# Patient Record
Sex: Female | Born: 2000 | Race: White | Hispanic: Yes | Marital: Single | Smoking: Never smoker
Health system: Southern US, Community
[De-identification: ages and names within clinical notes are randomized; demographics above are authoritative.]

## PROBLEM LIST (undated history)

## (undated) DIAGNOSIS — S83289A Other tear of lateral meniscus, current injury, unspecified knee, initial encounter: Secondary | ICD-10-CM

## (undated) DIAGNOSIS — S83249A Other tear of medial meniscus, current injury, unspecified knee, initial encounter: Secondary | ICD-10-CM

## (undated) DIAGNOSIS — S83511A Sprain of anterior cruciate ligament of right knee, initial encounter: Secondary | ICD-10-CM

## (undated) HISTORY — PX: NO PAST SURGERIES: SHX2092

---
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
## 2019-09-11 ENCOUNTER — Other Ambulatory Visit: Payer: Self-pay

## 2019-09-11 DIAGNOSIS — Z20822 Contact with and (suspected) exposure to covid-19: Secondary | ICD-10-CM

## 2019-09-12 LAB — NOVEL CORONAVIRUS, NAA: SARS-CoV-2, NAA: NOT DETECTED

## 2022-08-21 ENCOUNTER — Ambulatory Visit (INDEPENDENT_AMBULATORY_CARE_PROVIDER_SITE_OTHER): Payer: No Typology Code available for payment source | Admitting: Family Medicine

## 2022-08-21 ENCOUNTER — Encounter (HOSPITAL_BASED_OUTPATIENT_CLINIC_OR_DEPARTMENT_OTHER): Payer: Self-pay | Admitting: Family Medicine

## 2022-08-21 DIAGNOSIS — I10 Essential (primary) hypertension: Secondary | ICD-10-CM | POA: Diagnosis not present

## 2022-08-21 DIAGNOSIS — Z0000 Encounter for general adult medical examination without abnormal findings: Secondary | ICD-10-CM

## 2022-08-21 NOTE — Patient Instructions (Signed)
  Medication Instructions:  Your physician recommends that you continue on your current medications as directed. Please refer to the Current Medication list given to you today. --If you need a refill on any your medications before your next appointment, please call your pharmacy first. If no refills are authorized on file call the office.-- Lab Work: Your physician has recommended that you have lab work today: No If you have labs (blood work) drawn today and your tests are completely normal, you will receive your results via MyChart message OR a phone call from our staff.  Please ensure you check your voicemail in the event that you authorized detailed messages to be left on a delegated number. If you have any lab test that is abnormal or we need to change your treatment, we will call you to review the results.  Referrals/Procedures/Imaging: No  Follow-Up: Your next appointment:   Your physician recommends that you schedule a follow-up appointment in: 1-2 months cpe with Dr. de [place]. Nurse visit 1 week before for labs.   You will receive a text message or e-mail with a link to a survey about your care and experience with us today! We would greatly appreciate your feedback!   Thanks for letting us be apart of your health journey!!  Primary Care and Sports Medicine   Dr. Raymond de Cuba   We encourage you to activate your patient portal called "MyChart".  Sign up information is provided on this After Visit Summary.  MyChart is used to connect with patients for Virtual Visits (Telemedicine).  Patients are able to view lab/test results, encounter notes, upcoming appointments, etc.  Non-urgent messages can be sent to your provider as well. To learn more about what you can do with MyChart, please visit --  [email or web address].    

## 2022-08-21 NOTE — Progress Notes (Signed)
New Patient Office Visit  Subjective    Patient ID: Sharon Key, female    DOB: 2001-04-21  Age: 21 y.o. MRN: [id number]  CC:  Chief Complaint  Patient presents with   New Patient (Initial Visit)    Pt here to establish new care, pt only concern is dark spot on her neck     HPI Sharon Key presents to establish care Last PCP - pediatrician  Questions about potential adverse effects related to OCP - darkening of skin tags, some elevated BP readings. Home BP readings, checks about monthly, most recent was about 119/67. She has had OCP prescribed through an online provider  FH of thyroid issues, HLD  Patient is originally from [place]. 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 Outside of work, she is taking classes, she enjoys watching movies, taking her dog for walks.   Outpatient Encounter Medications as of 08/21/2022  Medication Sig   Norethin Ace-Eth Estrad-FE (JUNEL FE 1/20 PO)    [DISCONTINUED] oxyCODONE (ROXICODONE) 5 MG immediate release tablet 1-2 tablets every 4-6 hrs as needed for pain   [DISCONTINUED] promethazine (PHENERGAN) 12.5 MG tablet Take 1 tablet (12.5 mg total) by mouth every 6 (six) hours as needed for nausea or vomiting.   No facility-administered encounter medications on file as of 08/21/2022.    Past Medical History:  Diagnosis Date   Lateral meniscus tear 06/2017   right   Medial meniscus tear 06/2017   right   Right ACL tear 06/2017    Past Surgical History:  Procedure Laterality Date   KNEE ARTHROSCOPY WITH ANTERIOR CRUCIATE LIGAMENT (ACL) REPAIR WITH HAMSTRING GRAFT Right 06/19/2017   Procedure: RIGHT ANTERIOR CRUCIATE LIGAMENT (ACL) REPAIR;  Surgeon: Elsie Saas, MD;  Location: [facility];  Service: Orthopedics;  Laterality: Right;   KNEE ARTHROSCOPY WITH LATERAL MENISECTOMY Right 06/19/2017   Procedure: KNEE ARTHROSCOPY WITH LATERAL MENISECTOMY;  Surgeon: Elsie Saas, MD;   Location: [facility];  Service: Orthopedics;  Laterality: Right;   NO PAST SURGERIES      History reviewed. No pertinent family history.  Social History   Socioeconomic History   Marital status: Single    Spouse name: Not on file   Number of children: Not on file   Years of education: Not on file   Highest education level: Not on file  Occupational History   Not on file  Tobacco Use   Smoking status: Never   Smokeless tobacco: Never  Vaping Use   Vaping Use: Never used  Substance and Sexual Activity   Alcohol use: No   Drug use: No   Sexual activity: Not on file  Other Topics Concern   Not on file  Social History Narrative   Not on file   Social Determinants of Health   Financial Resource Strain: Not on file  Food Insecurity: Not on file  Transportation Needs: Not on file  Physical Activity: Not on file  Stress: Not on file  Social Connections: Not on file  Intimate Partner Violence: Not on file    Objective    BP (!) 155/95   Pulse 73   Temp 97.6 F (36.4 C) (Oral)   Ht 5\' 2"  (1.575 m)   Wt 154 lb 4.8 oz (70 kg)   SpO2 100%   BMI 28.22 kg/m   Physical Exam  22 year old female in no acute distress Cardiovascular exam with regular rate and rhythm, no murmur appreciated Lungs clear to auscultation  bilaterally  Assessment & Plan:   Problem List Items Addressed This Visit       Cardiovascular and Mediastinum   Elevated blood pressure reading in office with diagnosis of hypertension    Blood pressure is elevated in office today.  She does check her blood pressure occasionally at home, and reports that home readings have been at goal as compared to today's office reading Recommend intermittent monitoring of blood pressure at home, DASH diet We will continue to monitor blood pressure in the office as well and determine need for any further intervention such as pharmacotherapy      Other Visit Diagnoses     Wellness examination        Relevant Orders   CBC with Differential/Platelet   Comprehensive metabolic panel   Hemoglobin A1c   Lipid panel   TSH Rfx on Abnormal to Free T4       Return in about 6 weeks (around 10/02/2022) for CPE with [facility] a few days prior.   Shashwat Cleary J [place], MD

## 2022-08-21 NOTE — Assessment & Plan Note (Signed)
Blood pressure is elevated in office today.  She does check her blood pressure occasionally at home, and reports that home readings have been at goal as compared to today's office reading Recommend intermittent monitoring of blood pressure at home, DASH diet We will continue to monitor blood pressure in the office as well and determine need for any further intervention such as pharmacotherapy

## 2022-09-26 ENCOUNTER — Ambulatory Visit (HOSPITAL_BASED_OUTPATIENT_CLINIC_OR_DEPARTMENT_OTHER): Payer: PRIVATE HEALTH INSURANCE | Admitting: Family Medicine

## 2022-09-26 ENCOUNTER — Ambulatory Visit (HOSPITAL_BASED_OUTPATIENT_CLINIC_OR_DEPARTMENT_OTHER): Payer: No Typology Code available for payment source

## 2022-09-26 DIAGNOSIS — Z0000 Encounter for general adult medical examination without abnormal findings: Secondary | ICD-10-CM

## 2022-09-27 ENCOUNTER — Ambulatory Visit (HOSPITAL_BASED_OUTPATIENT_CLINIC_OR_DEPARTMENT_OTHER): Payer: No Typology Code available for payment source

## 2022-09-27 LAB — CBC WITH DIFFERENTIAL/PLATELET
Basophils Absolute: 0 10*3/uL (ref 0.0–0.2)
Basos: 1 %
EOS (ABSOLUTE): 0.1 10*3/uL (ref 0.0–0.4)
Eos: 3 %
Hematocrit: 43.2 % (ref 34.0–46.6)
Hemoglobin: 14.4 g/dL (ref 11.1–15.9)
Immature Grans (Abs): 0 10*3/uL (ref 0.0–0.1)
Immature Granulocytes: 0 %
Lymphocytes Absolute: 2.7 10*3/uL (ref 0.7–3.1)
Lymphs: 48 %
MCH: 29.5 pg (ref 26.6–33.0)
MCHC: 33.3 g/dL (ref 31.5–35.7)
MCV: 89 fL (ref 79–97)
Monocytes Absolute: 0.3 10*3/uL (ref 0.1–0.9)
Monocytes: 5 %
Neutrophils Absolute: 2.4 10*3/uL (ref 1.4–7.0)
Neutrophils: 43 %
Platelets: 253 10*3/uL (ref 150–450)
RBC: 4.88 x10E6/uL (ref 3.77–5.28)
RDW: 13.3 % (ref 11.7–15.4)
WBC: 5.5 10*3/uL (ref 3.4–10.8)

## 2022-09-27 LAB — COMPREHENSIVE METABOLIC PANEL
ALT: 11 IU/L (ref 0–32)
AST: 12 IU/L (ref 0–40)
Albumin/Globulin Ratio: 1.9 (ref 1.2–2.2)
Albumin: 4.6 g/dL (ref 4.0–5.0)
Alkaline Phosphatase: 64 IU/L (ref 44–121)
BUN/Creatinine Ratio: 16 (ref 9–23)
BUN: 11 mg/dL (ref 6–20)
Bilirubin Total: 0.3 mg/dL (ref 0.0–1.2)
CO2: 22 mmol/L (ref 20–29)
Calcium: 9.6 mg/dL (ref 8.7–10.2)
Chloride: 102 mmol/L (ref 96–106)
Creatinine, Ser: 0.68 mg/dL (ref 0.57–1.00)
Globulin, Total: 2.4 g/dL (ref 1.5–4.5)
Glucose: 111 mg/dL — ABNORMAL HIGH (ref 70–99)
Potassium: 4 mmol/L (ref 3.5–5.2)
Sodium: 139 mmol/L (ref 134–144)
Total Protein: 7 g/dL (ref 6.0–8.5)
eGFR: 125 mL/min/{1.73_m2} (ref >59)

## 2022-09-27 LAB — LIPID PANEL
Chol/HDL Ratio: 2.7 ratio (ref 0.0–4.4)
Cholesterol, Total: 144 mg/dL (ref 100–199)
HDL: 54 mg/dL (ref >39)
LDL Chol Calc (NIH): 74 mg/dL (ref 0–99)
Triglycerides: 81 mg/dL (ref 0–149)
VLDL Cholesterol Cal: 16 mg/dL (ref 5–40)

## 2022-09-27 LAB — HEMOGLOBIN A1C
Est. average glucose Bld gHb Est-mCnc: 105 mg/dL
Hgb A1c MFr Bld: 5.3 % (ref 4.8–5.6)

## 2022-09-27 LAB — TSH RFX ON ABNORMAL TO FREE T4: TSH: 2.57 u[IU]/mL (ref 0.450–4.500)

## 2022-10-03 ENCOUNTER — Ambulatory Visit (INDEPENDENT_AMBULATORY_CARE_PROVIDER_SITE_OTHER): Payer: No Typology Code available for payment source | Admitting: Family Medicine

## 2022-10-03 ENCOUNTER — Encounter (HOSPITAL_BASED_OUTPATIENT_CLINIC_OR_DEPARTMENT_OTHER): Payer: Self-pay | Admitting: Family Medicine

## 2022-10-03 VITALS — BP 145/86 | HR 85 | Temp 97.7°F | Ht 62.0 in | Wt 153.1 lb

## 2022-10-03 DIAGNOSIS — Z0000 Encounter for general adult medical examination without abnormal findings: Secondary | ICD-10-CM

## 2022-10-03 NOTE — Assessment & Plan Note (Signed)
Routine HCM labs reviewed. HCM reviewed/discussed. Anticipatory guidance regarding healthy weight, lifestyle and choices given. Recommend healthy diet.  Recommend approximately 150 minutes/week of moderate intensity exercise Recommend regular dental and vision exams Always use seatbelt/lap and shoulder restraints Recommend using smoke alarms and checking batteries at least twice a year Recommend using sunscreen when outside Discussed tetanus immunization recommendations, patient agreed to proceed with this today 

## 2022-10-03 NOTE — Progress Notes (Unsigned)
Subjective:    CC: Annual Physical Exam  HPI:  Sharon Key is a 21 y.o. presenting for annual physical  I reviewed the past medical history, family history, social history, surgical history, and allergies today and no changes were needed.  Please see the problem list section below in epic for further details.  Past Medical History: Past Medical History:  Diagnosis Date   Lateral meniscus tear 06/2017   right   Medial meniscus tear 06/2017   right   Right ACL tear 06/2017   Past Surgical History: Past Surgical History:  Procedure Laterality Date   KNEE ARTHROSCOPY WITH ANTERIOR CRUCIATE LIGAMENT (ACL) REPAIR WITH HAMSTRING GRAFT Right 06/19/2017   Procedure: RIGHT ANTERIOR CRUCIATE LIGAMENT (ACL) REPAIR;  Surgeon: Salvatore Marvel, MD;  Location: [facility];  Service: Orthopedics;  Laterality: Right;   KNEE ARTHROSCOPY WITH LATERAL MENISECTOMY Right 06/19/2017   Procedure: KNEE ARTHROSCOPY WITH LATERAL MENISECTOMY;  Surgeon: Salvatore Marvel, MD;  Location: [facility];  Service: Orthopedics;  Laterality: Right;   NO PAST SURGERIES     Social History: Social History   Socioeconomic History   Marital status: Single    Spouse name: Not on file   Number of children: Not on file   Years of education: Not on file   Highest education level: Not on file  Occupational History   Not on file  Tobacco Use   Smoking status: Never   Smokeless tobacco: Never  Vaping Use   Vaping Use: Never used  Substance and Sexual Activity   Alcohol use: No   Drug use: No   Sexual activity: Not on file  Other Topics Concern   Not on file  Social History Narrative   Not on file   Social Determinants of Health   Financial Resource Strain: Not on file  Food Insecurity: Not on file  Transportation Needs: Not on file  Physical Activity: Not on file  Stress: Not on file  Social Connections: Not on file   Family History: History reviewed. No pertinent  family history. Allergies: No Known Allergies Medications: See med rec.  Review of Systems: No headache, visual changes, nausea, vomiting, diarrhea, constipation, dizziness, abdominal pain, skin rash, fevers, chills, night sweats, swollen lymph nodes, weight loss, chest pain, body aches, joint swelling, muscle aches, shortness of breath, mood changes, visual or auditory hallucinations.  Objective:    BP (!) 145/86 (BP Location: Right Arm, Patient Position: Sitting, Cuff Size: Normal)   Pulse 85   Temp 97.7 F (36.5 C) (Oral)   Ht 5\' 2"  (1.575 m)   Wt 153 lb 1.6 oz (69.4 kg)   SpO2 100%   BMI 28.00 kg/m   General: Well Developed, well nourished, and in no acute distress. Neuro: Alert and oriented x3, extra-ocular muscles intact, sensation grossly intact. Cranial nerves II through XII are intact, motor, sensory, and coordinative functions are all intact. HEENT: Normocephalic, atraumatic, pupils equal round reactive to light, neck supple, no masses, no lymphadenopathy, thyroid nonpalpable. Oropharynx, nasopharynx, external ear canals are unremarkable. Skin: Warm and dry, no rashes noted. Cardiac: Regular rate and rhythm, no murmurs rubs or gallops.  Respiratory: Clear to auscultation bilaterally. Not using accessory muscles, speaking in full sentences. Abdominal: Soft, nontender, nondistended, positive bowel sounds, no masses, no organomegaly. Musculoskeletal: Shoulder, elbow, wrist, hip, knee, ankle stable, and with full range of motion.  Impression and Recommendations:    Wellness examination Routine HCM labs reviewed. HCM reviewed/discussed. Anticipatory guidance regarding healthy weight, lifestyle  and choices given. Recommend healthy diet.  Recommend approximately 150 minutes/week of moderate intensity exercise Recommend regular dental and vision exams Always use seatbelt/lap and shoulder restraints Recommend using smoke alarms and checking batteries at least twice a  year Recommend using sunscreen when outside Discussed tetanus immunization recommendations, patient agreed to proceed with this today  Return in about 1 year (around 10/04/2023) for CPE.   ___________________________________________ Genieve Ramaswamy de [place], MD, ABFM, CAQSM Primary Care and Sports Medicine [facility]

## 2022-10-03 NOTE — Patient Instructions (Signed)
  Medication Instructions:  Your physician recommends that you continue on your current medications as directed. Please refer to the Current Medication list given to you today. --If you need a refill on any your medications before your next appointment, please call your pharmacy first. If no refills are authorized on file call the office.-- Lab Work: Your physician has recommended that you have lab work today: No If you have labs (blood work) drawn today and your tests are completely normal, you will receive your results via MyChart message OR a phone call from our staff.  Please ensure you check your voicemail in the event that you authorized detailed messages to be left on a delegated number. If you have any lab test that is abnormal or we need to change your treatment, we will call you to review the results.  Referrals/Procedures/Imaging: No  Follow-Up: Your next appointment:   Your physician recommends that you schedule a follow-up appointment in: 1 year cpe with Dr. de [place].  You will receive a text message or e-mail with a link to a survey about your care and experience with us today! We would greatly appreciate your feedback!   Thanks for letting us be apart of your health journey!!  Primary Care and Sports Medicine   Dr. Raymond de Cuba   We encourage you to activate your patient portal called "MyChart".  Sign up information is provided on this After Visit Summary.  MyChart is used to connect with patients for Virtual Visits (Telemedicine).  Patients are able to view lab/test results, encounter notes, upcoming appointments, etc.  Non-urgent messages can be sent to your provider as well. To learn more about what you can do with MyChart, please visit --  [email or web address].    

## 2022-10-04 ENCOUNTER — Encounter (HOSPITAL_BASED_OUTPATIENT_CLINIC_OR_DEPARTMENT_OTHER): Payer: No Typology Code available for payment source | Admitting: Family Medicine

## 2022-10-10 ENCOUNTER — Ambulatory Visit (HOSPITAL_BASED_OUTPATIENT_CLINIC_OR_DEPARTMENT_OTHER): Payer: PRIVATE HEALTH INSURANCE | Admitting: Nurse Practitioner

## 2023-10-08 ENCOUNTER — Encounter (HOSPITAL_BASED_OUTPATIENT_CLINIC_OR_DEPARTMENT_OTHER): Payer: Self-pay | Admitting: Family Medicine

## 2023-10-15 ENCOUNTER — Ambulatory Visit (INDEPENDENT_AMBULATORY_CARE_PROVIDER_SITE_OTHER): Payer: 59 | Admitting: Family Medicine

## 2023-10-15 VITALS — BP 125/70 | HR 72 | Ht 63.0 in | Wt 159.8 lb

## 2023-10-15 DIAGNOSIS — Z7689 Persons encountering health services in other specified circumstances: Secondary | ICD-10-CM

## 2023-10-15 DIAGNOSIS — Z0000 Encounter for general adult medical examination without abnormal findings: Secondary | ICD-10-CM

## 2023-10-15 DIAGNOSIS — I10 Essential (primary) hypertension: Secondary | ICD-10-CM | POA: Insufficient documentation

## 2023-10-15 NOTE — Assessment & Plan Note (Signed)
Routine HCM labs ordered. HCM reviewed/discussed. Anticipatory guidance regarding healthy weight, lifestyle and choices given. Recommend healthy diet.  Recommend approximately 150 minutes/week of moderate intensity exercise Recommend regular dental and vision exams Always use seatbelt/lap and shoulder restraints Recommend using smoke alarms and checking batteries at least twice a year Recommend using sunscreen when outside Discussed tetanus immunization recommendations, thinks UTD, will check with [facility]

## 2023-10-15 NOTE — Progress Notes (Signed)
Subjective:    CC: Annual Physical Exam  HPI:  Sharon Key is a 22 y.o. presenting for annual physical  I reviewed the past medical history, family history, social history, surgical history, and allergies today and no changes were needed.  Please see the problem list section below in epic for further details.  Past Medical History: Past Medical History:  Diagnosis Date   Lateral meniscus tear 06/2017   right   Medial meniscus tear 06/2017   right   Right ACL tear 06/2017   Past Surgical History: Past Surgical History:  Procedure Laterality Date   KNEE ARTHROSCOPY WITH ANTERIOR CRUCIATE LIGAMENT (ACL) REPAIR WITH HAMSTRING GRAFT Right 06/19/2017   Procedure: RIGHT ANTERIOR CRUCIATE LIGAMENT (ACL) REPAIR;  Surgeon: Salvatore Marvel, MD;  Location: [facility];  Service: Orthopedics;  Laterality: Right;   KNEE ARTHROSCOPY WITH LATERAL MENISECTOMY Right 06/19/2017   Procedure: KNEE ARTHROSCOPY WITH LATERAL MENISECTOMY;  Surgeon: Salvatore Marvel, MD;  Location: [facility];  Service: Orthopedics;  Laterality: Right;   NO PAST SURGERIES     Social History: Social History   Socioeconomic History   Marital status: Single    Spouse name: Not on file   Number of children: Not on file   Years of education: Not on file   Highest education level: 12th grade  Occupational History   Not on file  Tobacco Use   Smoking status: Never    Passive exposure: Never   Smokeless tobacco: Never  Vaping Use   Vaping status: Never Used  Substance and Sexual Activity   Alcohol use: No   Drug use: No   Sexual activity: Not on file  Other Topics Concern   Not on file  Social History Narrative   Not on file   Social Determinants of Health   Financial Resource Strain: Low Risk  (10/15/2023)   Overall Financial Resource Strain (CARDIA)    Difficulty of Paying Living Expenses: Not very hard  Food Insecurity: No Food Insecurity (10/15/2023)   Hunger Vital  Sign    Worried About Running Out of Food in the Last Year: Never true    Ran Out of Food in the Last Year: Never true  Transportation Needs: No Transportation Needs (10/15/2023)   PRAPARE - Administrator, Civil Service (Medical): No    Lack of Transportation (Non-Medical): No  Physical Activity: Insufficiently Active (10/15/2023)   Exercise Vital Sign    Days of Exercise per Week: 2 days    Minutes of Exercise per Session: 30 min  Stress: No Stress Concern Present (10/15/2023)   [employer] of Occupational Health - Occupational Stress Questionnaire    Feeling of Stress : Only a little  Social Connections: Unknown (10/15/2023)   Social Connection and Isolation Panel [NHANES]    Frequency of Communication with Friends and Family: Twice a week    Frequency of Social Gatherings with Friends and Family: Once a week    Attends Religious Services: More than 4 times per year    Active Member of [employer] or Organizations: No    Attends Engineer, structural: Not on file    Marital Status: Patient declined   Family History: History reviewed. No pertinent family history. Allergies: No Known Allergies Medications: See med rec.  Review of Systems: No headache, visual changes, nausea, vomiting, diarrhea, constipation, dizziness, abdominal pain, skin rash, fevers, chills, night sweats, swollen lymph nodes, weight loss, chest pain, body aches, joint swelling, muscle aches, shortness  of breath, mood changes, visual or auditory hallucinations.  Objective:    BP 125/70 (BP Location: Right Arm, Patient Position: Sitting, Cuff Size: Normal)   Pulse 72   Ht 5\' 3"  (1.6 m)   Wt 159 lb 12.8 oz (72.5 kg)   SpO2 100%   BMI 28.31 kg/m   General: Well Developed, well nourished, and in no acute distress.  Neuro: Alert and oriented x3, extra-ocular muscles intact, sensation grossly intact. Cranial nerves II through XII are intact, motor, sensory, and coordinative functions are all  intact. HEENT: Normocephalic, atraumatic, pupils equal round reactive to light, neck supple, no masses, no lymphadenopathy, thyroid nonpalpable. Oropharynx, nasopharynx, external ear canals are unremarkable. Skin: Warm and dry, no rashes noted.  Cardiac: Regular rate and rhythm, no murmurs rubs or gallops.  Respiratory: Clear to auscultation bilaterally. Not using accessory muscles, speaking in full sentences.  Abdominal: Soft, nontender, nondistended, positive bowel sounds, no masses, no organomegaly.  Musculoskeletal: Shoulder, elbow, wrist, hip, knee, ankle stable, and with full range of motion.  Impression and Recommendations:    Wellness examination Assessment & Plan: Routine HCM labs ordered. HCM reviewed/discussed. Anticipatory guidance regarding healthy weight, lifestyle and choices given. Recommend healthy diet.  Recommend approximately 150 minutes/week of moderate intensity exercise Recommend regular dental and vision exams Always use seatbelt/lap and shoulder restraints Recommend using smoke alarms and checking batteries at least twice a year Recommend using sunscreen when outside Discussed tetanus immunization recommendations, thinks UTD, will check with [facility]  Orders: -     CBC with Differential/Platelet; Future -     Comprehensive metabolic panel; Future -     Hemoglobin A1c; Future -     Lipid panel; Future -     TSH Rfx on Abnormal to Free T4; Future  Encounter to establish care -     Ambulatory referral to Obstetrics / Gynecology  Return in about 1 year (around 10/14/2024) for CPE.   ___________________________________________ Jervis Trapani de [place], MD, ABFM, CAQSM Primary Care and Sports Medicine [facility]

## 2023-10-18 ENCOUNTER — Encounter (HOSPITAL_BASED_OUTPATIENT_CLINIC_OR_DEPARTMENT_OTHER): Payer: 59 | Admitting: Certified Nurse Midwife

## 2023-11-22 ENCOUNTER — Encounter (HOSPITAL_BASED_OUTPATIENT_CLINIC_OR_DEPARTMENT_OTHER): Payer: 59 | Admitting: Certified Nurse Midwife

## 2023-11-28 ENCOUNTER — Encounter (HOSPITAL_BASED_OUTPATIENT_CLINIC_OR_DEPARTMENT_OTHER): Payer: Self-pay

## 2024-10-15 ENCOUNTER — Encounter (HOSPITAL_BASED_OUTPATIENT_CLINIC_OR_DEPARTMENT_OTHER): Payer: 59 | Admitting: Family Medicine
